# Patient Record
Sex: Male | Born: 1965 | Race: Asian | Hispanic: No | Marital: Married | Smoking: Never smoker
Health system: Southern US, Community
[De-identification: ages and names within clinical notes are randomized; demographics above are authoritative.]

## PROBLEM LIST (undated history)

## (undated) DIAGNOSIS — K759 Inflammatory liver disease, unspecified: Secondary | ICD-10-CM

## (undated) HISTORY — PX: SMALL INTESTINE SURGERY: SHX150

---

## 2011-05-29 ENCOUNTER — Ambulatory Visit: Payer: Self-pay | Admitting: Gastroenterology

## 2011-09-13 ENCOUNTER — Emergency Department (HOSPITAL_COMMUNITY)
Admission: EM | Admit: 2011-09-13 | Discharge: 2011-09-14 | Disposition: A | Discharge status: Home / Self Care | Payer: 59 | Attending: Emergency Medicine | Admitting: Emergency Medicine

## 2011-09-13 ENCOUNTER — Encounter: Payer: Self-pay | Admitting: *Deleted

## 2011-09-13 ENCOUNTER — Other Ambulatory Visit: Payer: Self-pay

## 2011-09-13 DIAGNOSIS — R071 Chest pain on breathing: Secondary | ICD-10-CM | POA: Insufficient documentation

## 2011-09-13 DIAGNOSIS — R0781 Pleurodynia: Secondary | ICD-10-CM

## 2011-09-13 NOTE — ED Notes (Signed)
Showed ECG to Dr. Bebe Shaggy.

## 2011-09-13 NOTE — ED Notes (Signed)
Chest pain on and off for about a few months intermittently and today his left chest pain was sharp and he couldn't breathe fully because it hurts more

## 2011-09-14 ENCOUNTER — Emergency Department (HOSPITAL_COMMUNITY): Payer: 59

## 2011-09-14 ENCOUNTER — Other Ambulatory Visit: Payer: Self-pay

## 2011-09-14 LAB — DIFFERENTIAL
Basophils Absolute: 0 10*3/uL (ref 0.0–0.1)
Basophils Relative: 0 % (ref 0–1)
Eosinophils Absolute: 0.2 10*3/uL (ref 0.0–0.7)
Eosinophils Relative: 4 % (ref 0–5)
Monocytes Absolute: 0.7 10*3/uL (ref 0.1–1.0)
Monocytes Relative: 11 % (ref 3–12)
Neutrophils Relative %: 44 % (ref 43–77)

## 2011-09-14 LAB — CBC
MCV: 69.2 fL — ABNORMAL LOW (ref 78.0–100.0)
Platelets: 146 10*3/uL — ABNORMAL LOW (ref 150–400)
RBC: 5.75 MIL/uL (ref 4.22–5.81)
WBC: 6.3 10*3/uL (ref 4.0–10.5)

## 2011-09-14 MED ORDER — NAPROXEN 500 MG PO TABS: 500.0000 mg | ORAL_TABLET | Freq: Two times a day (BID) | ORAL | Status: DC

## 2011-09-14 NOTE — ED Notes (Signed)
Patient back from  X-ray 

## 2011-09-14 NOTE — ED Notes (Signed)
MD at bedside. Dr.Miller evaluating patient. Patient is alert and oriented x3. resp even unlabored. Skin w/d. Distal pulses present. Patient reported that chest pain is gone now, but he had episodes of sharp pain with deep breathing. He denies any sob, denies n/v.

## 2011-09-14 NOTE — ED Provider Notes (Signed)
History     CSN: [id number] Arrival date & time: 09/13/2011 10:08 PM   First MD Initiated Contact with Patient 09/14/11 0019      Chief Complaint  Patient presents with  . Chest Pain    (Consider location/radiation/quality/duration/timing/severity/associated sxs/prior treatment) HPI Comments: Left-sided sharp and stabbing chest pain worse with breathing, onset 2-1/2 hours ago. 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 No swelling of the legs, trauma, surgery. Symptoms are improving over the last 2 and half hours and currently are mild even with deep breathin but initially this was what made his pain severe  Patient is a 45 y.o. male presenting with chest pain. The history is provided by the patient.  Chest Pain The chest pain began 3 - 5 hours ago. Chest pain occurs intermittently. The chest pain is improving. The pain is associated with breathing. The severity of the pain is mild. The quality of the pain is described as sharp and stabbing. The pain does not radiate. Chest pain is worsened by deep breathing. Pertinent negatives for primary symptoms include no fever, no fatigue, no syncope, no shortness of breath, no cough, no wheezing, no palpitations, no abdominal pain, no nausea, no vomiting and no dizziness.  Pertinent negatives for associated symptoms include no diaphoresis and no lower extremity edema. He tried nothing for the symptoms. Risk factors: Travel, patient states [place] and [place] frequently, last traveling 3 weeks ago.  Pertinent negatives for past medical history include no diabetes, no DVT, no hyperlipidemia, no hypertension, no PE and no strokes.  His family medical history is significant for stroke in family.  Procedure history is negative for cardiac catheterization, echocardiogram and exercise treadmill test.     History reviewed. No  pertinent past medical history.  History reviewed. No pertinent past surgical history.  History reviewed. No pertinent family history.  History  Substance Use Topics  . Smoking status: Never Smoker   . Smokeless tobacco: Not on file  . Alcohol Use: 0.6 oz/week    1 Glasses of wine per week      Review of Systems  Constitutional: Negative for fever, diaphoresis and fatigue.  Respiratory: Negative for cough, shortness of breath and wheezing.   Cardiovascular: Positive for chest pain. Negative for palpitations and syncope.  Gastrointestinal: Negative for nausea, vomiting and abdominal pain.  Neurological: Negative for dizziness.  All other systems reviewed and are negative.    Allergies  Review of patient's allergies indicates no known allergies.  Home Medications   Current Outpatient Rx  Name Route Sig Dispense Refill  . OMEGA-3 FATTY ACIDS 1000 MG PO CAPS Oral Take 1 g by mouth daily.      Marland Kitchen VITAMIN D (CHOLECALCIFEROL) PO Oral Take 1 tablet by mouth 2 (two) times a week.      Marland Kitchen NAPROXEN 500 MG PO TABS Oral Take 1 tablet (500 mg total) by mouth 2 (two) times daily. 30 tablet 0    BP 115/77  Pulse 55  Temp(Src) 97.8 F (36.6 C) (Oral)  Resp 16  SpO2 100%  Physical Exam  Nursing note and vitals reviewed. Constitutional: He appears well-developed and well-nourished. No distress.  HENT:  Head: Normocephalic and atraumatic.  Mouth/Throat: Oropharynx is clear and moist. No oropharyngeal exudate.  Eyes: Conjunctivae and EOM are normal. Pupils are equal, round, and reactive to light. Right eye exhibits no discharge. Left  eye exhibits no discharge. No scleral icterus.  Neck: Normal range of motion. Neck supple. No JVD present. No thyromegaly present.  Cardiovascular: Normal rate, regular rhythm, normal heart sounds and intact distal pulses.  Exam reveals no gallop and no friction rub.   No murmur heard. Pulmonary/Chest: Effort normal and breath sounds normal. No  respiratory distress. He has no wheezes. He has no rales. He exhibits no tenderness.  Abdominal: Soft. Bowel sounds are normal. He exhibits no distension and no mass. There is no tenderness.  Musculoskeletal: Normal range of motion. He exhibits no edema and no tenderness.  Lymphadenopathy:    He has no cervical adenopathy.  Neurological: He is alert. Coordination normal.  Skin: Skin is warm and dry. No rash noted. No erythema.  Psychiatric: He has a normal mood and affect. His behavior is normal.    ED Course  Procedures (including critical care time)  Labs Reviewed  CBC - Abnormal; Notable for the following:    Hemoglobin 12.9 (*)    MCV 69.2 (*)    MCH 22.4 (*)    Platelets 146 (*)    All other components within normal limits  DIFFERENTIAL  D-DIMER, QUANTITATIVE  POCT I-STAT TROPONIN I  I-STAT TROPONIN I  I-STAT TROPONIN I   Dg Chest 2 View  09/14/2011  *RADIOLOGY REPORT*  Clinical Data: Left-sided pleuritic chest pain.  CHEST - 2 VIEW  Comparison: None.  Findings: The lungs are well-aerated and clear.  There is no evidence of focal opacification, pleural effusion or pneumothorax.  The heart is normal in size; the mediastinal contour is within normal limits.  No acute osseous abnormalities are seen.  IMPRESSION: No acute cardiopulmonary process seen.  Original Report Authenticated By: Tonia Ghent, M.D.     1. Pleuritic chest pain       MDM  Totally normal exam without reproducible pain, pleural or pericardial rubs, hypoxia, or other abnormal vital signs. EKG shows a sinus bradycardia without any signs of ischemia or abnormality. We'll proceed with chest x-ray, d-dimer.  ED ECG REPORT   Date: 09/14/2011   Rate: 57  Rhythm: sinus bradycardia  QRS Axis: normal  Intervals: normal  ST/T Wave abnormalities: normal  Conduction Disutrbances:none  Narrative Interpretation:   Old EKG Reviewed: none available   Labs show no significant abnormalities, d-dimer normal,  chest x-ray negative for effusion or infection. EKG nonischemic and troponin normal. Findings described and explained to patient who has expressed his understanding, will initiate therapy with anti-inflammatories and followup as indicated. Doubt acute coronary syndrome given the patient's extremely low-risk, atypical nature of pain and laboratory workup findings.     Vida Roller, MD 09/14/11 [phone]

## 2011-09-14 NOTE — ED Notes (Signed)
Patient to Xray via stretcher.

## 2011-10-16 ENCOUNTER — Ambulatory Visit (INDEPENDENT_AMBULATORY_CARE_PROVIDER_SITE_OTHER): Payer: 59 | Admitting: Gastroenterology

## 2011-10-16 DIAGNOSIS — B181 Chronic viral hepatitis B without delta-agent: Secondary | ICD-10-CM

## 2011-10-23 NOTE — Progress Notes (Signed)
NAMEJOSEPH, BIAS    MR#:  [id number]      DATE:  10/16/2011  DOB:  [birth year]    cc: Kirby Funk, MD, [facility] [place], [address] [place], [address], [place], [place] [address], Fax number: [phone]    REFERRING AND PRIMARY CARE PHYSICIAN:  Kirby Funk, MD.   Reason for referral:  Genotype unknown, E antibody status unknown, hepatitis B, and elevated iron saturation.  According to the patient, he had no known history of hepatitis B until he underwent assessment as part of having a surrogate mother for him and his wife's child, in 2008 or 2009. He was discovered as part of  routine testing to be hepatitis B surface antigen positive, while living [place]. There was no further evaluation at that time. He subsequently moved to [facility] for work, around 01/24/2011.  He presented to his primary doctor, Dr. Valentina Lucks, on 01/17/2011, with complaints of feeling unwell, including headaches and nausea. At that time, on 01/17/2011, his ALT was 1012, with an AST of 451, ALP of 139,  and total bilirubin 1.1. Subsequently, on 01/31/2011, his ALT had fallen to 181, with an AST 45, ALP of 124, and total bilirubin of 0.8. On 01/27/2011, testing showed he was total hepatitis B core antibody  positive, hepatitis B surface antibody negative, hepatitis B surface antigen positive, hepatitis B/E antigen negative. HCV RNA was negative. On looking back at other notes, he was hepatitis C antibody  negative, on 01/24/2011. His iron saturation, on 01/24/2011, was 88%, and his ferritin was 222.7, on 01/24/2011. The patient reports that those symptoms resolved.  Currently, there are no symptoms to suggest active hepatitis B or decompensated liver disease. He has no symptoms of heart failure or MCP arthritis or history of diabetes.  With respect to risk factors for liver disease, he rarely drinks alcohol, perhaps no more than once a month. There is no history of intravenous or  intranasal drug, tattoos, unsterile body piercing, or  blood transfusion, prior to 1992.   In terms of his family history, he denies any family history of liver disease, but reports that his wife may have had hepatitis B and cleared this. They have 1 child, who was born in the [place]. He  believes the child has been vaccinated. He believes he may have started hepatitis A vaccine, as he was A-negative, but cannot recall completing this.   Past medical history:  Negative.    PAST SURGICAL HISTORY:  While living in [place] as a child, at the age of 1, he had removal of an intestinal worm; and, at the age of 49, he had tooth extraction.    Past psychiatric history:  During a break-up with a girlfriend, several years ago, he saw a therapist, but was never hospitalized, and did not have long-term pharmacologic or psychotherapy.    CURRENT MEDICATIONS:  Flonase p.r.n. for allergies and Ambien 10 mg p.o. at bedtime p.r.n.    ALLERGIES:  Denies.    Habits:  Smoking, denies. Alcohol, as above.   FAMILY HISTORY:  As above.   SOCIAL HISTORY:  He is married, with 1 child. 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 He immigrated from [place], at 45  years of age, to [place]. He had, combined, approximately 2-3 years of high school and college, back in [place], as a late teen.   REVIEW OF SYSTEMS:  All 10 systems reviewed today with the patient and are negative, other  than which is mentioned above. CES-D was 1.   PHYSICAL EXAMINATION:  Constitutional: Well-appearing, without stigmata of chronic liver disease. Vital signs: Height 68 inches, weight 163 pounds, blood pressure 130/91, pulse 81, temperature 97.9 Fahrenheit.  Ears, nose,  mouth and throat:  Unremarkable oropharynx.  No thyromegaly or neck masses.  Chest:  Resonant to percussion.  Clear to auscultation.  Cardiovascular:  Heart sounds normal S1, S2 without murmurs or rubs.   There is no peripheral  edema.  Abdominal:  Normal bowel sounds.  No masses or tenderness.  I could not appreciate a liver edge or spleen tip.  I could not appreciate any hernias.  Lymphatics:  No cervical or  inguinal lymphadenopathy.  Central Nervous System:  No asterixis or focal neurologic findings.  Dermatologic:  Anicteric without palmar  erythema or spider angiomata.  Eyes:  Anicteric sclerae.  Pupils are equal and reactive to light.   LAB WORK:  As mentioned above.   ASSESSMENT:  The patient is a 45 year old gentleman, with a history of most likely chronic hepatitis B. One wonders, in 01/2011, if he was not flaring and possibly clearing virus. We will see if this is true by repeating  his serologies today. Of course, this will also further characterize his hepatitis and, if he has not cleared, would determine the need for treatment.  In terms of the elevated ferritin and saturation, I suspect that these were elevated as an acute phase reactant. To be sure, the ferritin is quite high and somewhat higher than one would expect for an acute phase reactant, but it is also highly unlikely that an Asian would have hemochromatosis. These should be repeated.  In my discussion, today, with the patient, we discussed the nature and natural history of hepatitis B. I told him he may have flared or he may have been attempting to clear a virus, in 01/2011.  We discussed  assessment, with lab testing, today. We discussed treatment, and the basis for treatment. I also explained to him that it is unlikely he has hemochromatosis, but we will need to repeat his iron saturation  and ferritin testing. I have explained the importance of ensuring that his family is appropriately tested and vaccinated.   PLAN:  1. We will send hepatitis B serologies, including his HBV DNA, genotype, and delta antibody. 2. We will test iron saturation and ferritin. Because he had to go back to work, today, he is going to come back for his lab work, and  will do so fasting, which will improve the specificity of the iron saturation. 3. He will return in approximately 3 months' time, to follow up on the results of testing; but I will contact him earlier, should he need to commence therapy for hepatitis B. 4. I have encouraged him to review a number web sites, including the Hepatitis B Foundation and the [employer], for information regarding hepatitis B.            Brooke Dare, MD   ADDENDUM A week later there is no record of any labs being done using our [facility] account.  [id number] .[id number]  D:  Thu Dec 13 20:33:44 2012 ; T:  Fri Dec 14 19:37:29 2012  Job #:  [id number]

## 2011-12-19 LAB — PROTIME-INR: INR: 1.06 (ref <1.50)

## 2011-12-20 LAB — CBC WITH DIFFERENTIAL/PLATELET
Basophils Absolute: 0 10*3/uL (ref 0.0–0.1)
Eosinophils Absolute: 0.2 10*3/uL (ref 0.0–0.7)
Eosinophils Relative: 3 % (ref 0–5)
Lymphocytes Relative: 38 % (ref 12–46)
MCV: 71.1 fL — ABNORMAL LOW (ref 78.0–100.0)
Neutrophils Relative %: 51 % (ref 43–77)
Platelets: 177 10*3/uL (ref 150–400)
RDW: 14.7 % (ref 11.5–15.5)
WBC: 5.6 10*3/uL (ref 4.0–10.5)

## 2011-12-20 LAB — COMPLETE METABOLIC PANEL WITH GFR
AST: 31 U/L (ref 0–37)
Albumin: 4.5 g/dL (ref 3.5–5.2)
BUN: 17 mg/dL (ref 6–23)
CO2: 26 mEq/L (ref 19–32)
Calcium: 9.4 mg/dL (ref 8.4–10.5)
Chloride: 104 mEq/L (ref 96–112)
GFR, Est African American: >89 mL/min
Glucose, Bld: 143 mg/dL — ABNORMAL HIGH (ref 70–99)
Potassium: 4.3 mEq/L (ref 3.5–5.3)

## 2011-12-22 LAB — HEPATITIS B E ANTIBODY: Hepatitis Be Antibody: POSITIVE — AB

## 2011-12-22 LAB — HEPATITIS B E ANTIGEN: Hepatitis Be Antigen: NEGATIVE

## 2011-12-23 LAB — HEPATITIS B DNA, ULTRAQUANTITATIVE, PCR
Hepatitis B DNA (Calc): 132516 copies/mL — ABNORMAL HIGH (ref <116)
Hepatitis B DNA: 22769 IU/mL — ABNORMAL HIGH (ref <20)

## 2011-12-25 LAB — HEPATITIS DELTA ANTIBODY

## 2012-01-14 LAB — HEPATITIS B VIRUS CORE/PRECORE

## 2012-01-15 ENCOUNTER — Ambulatory Visit (INDEPENDENT_AMBULATORY_CARE_PROVIDER_SITE_OTHER): Payer: 59 | Admitting: Gastroenterology

## 2012-01-15 DIAGNOSIS — B181 Chronic viral hepatitis B without delta-agent: Secondary | ICD-10-CM

## 2012-01-22 NOTE — Progress Notes (Signed)
Roberto Carrillo, Roberto Carrillo    MR#:  [id number]      DATE:  01/15/2012  DOB:  12-27-1965    cc: Primary care physician:  Same Referring physician: Kirby Funk, MD, [facility] [place], [address] [place], [address], [place], [place] [address], Fax [phone]    REASON FOR VISIT:  Follow up of genotype B, E antibody positive hepatitis B.   history:   The patient returns today unaccompanied. Since last being seen, there have been no new events referable to his history of hepatitis B. There are no symptoms to suggest decompensated liver disease or vasculitis.   PAST MEDICAL HISTORY:  No interval change.   CURRENT MEDICATIONS:   1. Ambien 10 mg p.o. at bedtime p.r.n.  2. Flonase p.r.n. for allergies.   ALLERGIES: Denies.   HABITS: Smoking denies. Alcohol, denies interval consumption.   REVIEW OF SYSTEMS:  All 10 systems reviewed today with the patient and they are negative other than which was mentioned above. His CES-D was zero.   PHYSICAL EXAMINATION: Constitutional: Well appearing without significant peripheral wasting. Vital signs: Height 68 inches, weight 163 pounds, blood pressure 127/95, pulse 72, temperature 96.9 Fahrenheit.   LABORATORY STUDIES:  It should be noted that his lab work was delayed for some reason. His appointment was on 10/16/2011, but he did not get his labs until 12/19/2011. This demonstrated that he was E antibody positive, genotype B, with a viral load of 22,769 international units per mL. His ALT was 33 and his delta antibody was negative.   ASSESSMENT: The patient is a 46 year old gentleman with a history of E antibody positive, genotype B HBV. It should be noted that his liver enzymes are within normal range, but his viral load is well over 2000 international units per mL. This could indicate immune tolerance. I would like a biopsy to assess the degree of inflammation and fibrosis, particularly because he has had a long duration of  infection due to his ethnicity. If there is significant inflammation and fibrosis on his biopsy, it would be worth treating him. However, if there is no significant change on his biopsy, he could be observed.   In terms of his liver care, for some reason his hepatitis A testing has not been done, which can be done at his next visit because I am not planning on doing any lab work today. There are no symptoms of decompensation and he does not need to be screened for varices. He will need to start screening for hepatocellular cancer [facility]) because he is over the age of 32 and Asian. The first of these screening tests can be accomplished through his ultrasound-guided liver biopsy, which I planning to do.   In my discussion today with the patient, we discussed his lab results and the implications thereof. I explained that a biopsy would be of value to determine if he needs to be put on treatment. I discussed how a biopsy is done and the risks thereof. I have explained this will be done at [facility].   PLAN:  1. Order liver biopsy.  2. Will check hepatitis A antibody test at the next clinic appointment. 3. Once the result of the biopsy is in, will send him a letter regarding the results and if need be start him on therapy.  4. If the liver biopsy shows no significant change, we will see him again in 6 months' time, and then every 6 months for monitoring. At that visit I can start  ordering his ultrasounds.               Brooke Dare, MD   [phone]  D:  Thu Mar 14 16:47:42 2013 ; T:  Thu Mar 14 21:06:29 2013  Job #:  [id number]

## 2012-02-10 ENCOUNTER — Other Ambulatory Visit: Payer: Self-pay | Admitting: Radiology

## 2012-02-12 ENCOUNTER — Encounter (HOSPITAL_COMMUNITY): Payer: Self-pay | Admitting: Pharmacy Technician

## 2012-02-13 ENCOUNTER — Other Ambulatory Visit: Payer: Self-pay | Admitting: Radiology

## 2012-02-20 ENCOUNTER — Ambulatory Visit (HOSPITAL_COMMUNITY)
Admission: RE | Admit: 2012-02-20 | Discharge: 2012-02-20 | Disposition: A | Discharge status: Home / Self Care | Payer: 59 | Source: Ambulatory Visit | Attending: Gastroenterology | Admitting: Gastroenterology

## 2012-02-20 ENCOUNTER — Encounter (HOSPITAL_COMMUNITY): Payer: Self-pay

## 2012-02-20 VITALS — BP 112/79 | HR 69 | Temp 98.4°F | Resp 16 | Ht 68.0 in | Wt 158.0 lb

## 2012-02-20 DIAGNOSIS — B181 Chronic viral hepatitis B without delta-agent: Secondary | ICD-10-CM

## 2012-02-20 DIAGNOSIS — B1910 Unspecified viral hepatitis B without hepatic coma: Secondary | ICD-10-CM | POA: Insufficient documentation

## 2012-02-20 HISTORY — DX: Inflammatory liver disease, unspecified: K75.9

## 2012-02-20 LAB — CBC
HCT: 40.6 % (ref 39.0–52.0)
Hemoglobin: 13.6 g/dL (ref 13.0–17.0)
MCH: 22.8 pg — ABNORMAL LOW (ref 26.0–34.0)
MCHC: 33.5 g/dL (ref 30.0–36.0)

## 2012-02-20 MED ORDER — MIDAZOLAM HCL 2 MG/2ML IJ SOLN
INTRAMUSCULAR | Status: AC
  Filled 2012-02-20: qty 4

## 2012-02-20 MED ORDER — FENTANYL CITRATE 0.05 MG/ML IJ SOLN
INTRAMUSCULAR | Status: AC
  Filled 2012-02-20: qty 4

## 2012-02-20 MED ORDER — SODIUM CHLORIDE 0.9 % IV SOLN: Freq: Once | INTRAVENOUS | Status: DC

## 2012-02-20 MED ORDER — MIDAZOLAM HCL 2 MG/2ML IJ SOLN
INTRAMUSCULAR | Status: AC
  Filled 2012-02-20: qty 6

## 2012-02-20 MED ORDER — MIDAZOLAM HCL 5 MG/5ML IJ SOLN
INTRAMUSCULAR | Status: AC | PRN
  Administered 2012-02-20: 0.5 mg via INTRAVENOUS
  Administered 2012-02-20: 1 mg via INTRAVENOUS

## 2012-02-20 MED ORDER — FENTANYL CITRATE 0.05 MG/ML IJ SOLN
INTRAMUSCULAR | Status: AC | PRN
  Administered 2012-02-20 (×2): 25 ug via INTRAVENOUS

## 2012-02-20 MED ORDER — HYDROCODONE-ACETAMINOPHEN 5-325 MG PO TABS
1.0000 | ORAL_TABLET | ORAL | Status: DC | PRN
  Filled 2012-02-20: qty 1

## 2012-02-20 NOTE — Discharge Instructions (Addendum)
Liver Biopsy Care After These instructions give you information on caring for yourself after your procedure. Your doctor may also give you more specific instructions. Call your doctor if you have any problems or questions after your procedure. HOME CARE  Watch for bleeding at your biopsy site.   No heavy lifting, pushing, or pulling for 48 hours (2 days).   No exercise, jogging, or sex for 48 hours (2 days).   Do not drive or use heavy machinery for 24 hours (1 day).   Go back to your usual diet and medicines as told by your doctor.   Do not take the bandage off until the next morning.   Only take medicine as told by your doctor.   Do not shower or bathe until the next day.  GET HELP RIGHT AWAY IF:  You have shortness of breath or trouble breathing.   You have pain or cramping in your belly (abdomen).   You feel sick to your stomach (nauseous) or throw up (vomit).   Bleeding does not stop from the place where the needle was put in. Press on the place that is bleeding until you are checked in the Emergency Room.   Yellowish white fluid (pus) is coming from the place where the needle was put in.   You have any unusual pain that will not stop.   You have puffiness (swelling) or redness at the place where the needle was put in, or if the place is very sore or hot when you touch it.   You have a fever of more than 102 F (38.9 C) for 2 or more days.   You have black, smelly poops (bowel movements).  If you go to the Emergency Room, tell the nurse that you had a liver biopsy. Take this paper with you and show it to the nurse. Keep your follow-up appointment. MAKE SURE YOU:  Understand these instructions.   Will watch your condition.   Will get help right away if you are not doing well or get worse.  Document Released: 07/29/2008 Document Revised: 10/09/2011 Document Reviewed: 07/29/2008 [facility] Patient Information 2012 [employer]   Hepatitis B Hepatitis B is a  viral infection of the liver. Over half the people who become infected with hepatitis B never feel sick. However, some may later develop long-term liver disease (chronic hepatitis). There are 2 phases of the disease: sudden (acute) and longstanding (chronic). CAUSES Hepatitis B is caused by the hepatitis B virus (HBV). It can enter the body by sharing needles contaminated with blood from an infected person, by sharing intimate items such as toothbrushes and razors, or by sex with an infected person. A baby can get HBV from its birth mother. A caregiver may also get it from exposure to the blood of an infected patient by way of a cut or needle stick.  SYMPTOMS Acute Phase Many cases of acute HBV infection are mild and cause few problems.Some people may not even realize they are sick.Symptoms in others may last a few weeks to several months and include:  Loss of appetite.   Feeling very tired.   Nausea.   Vomiting.   Abdominal pain.   Dark yellow urine.   Yellow skin and eyes (jaundice).  Chronic Phase  About 5% of people who get HBV infection become "chronic carriers." They often have no symptoms, but the virus stays in their body. They may spread the virus to others and can get long-term liver disease. The younger a child  is when the infection starts, the more likely that child will be a carrier.   About 25% of chronic HBV carriers get a disease called "chronic active hepatitis." These people may develop scarring of the liver (cirrhosis), liver failure, or liver cancer.  DIAGNOSIS Your caregiver can do a blood test to see if you have the disease. TREATMENT Acute hepatitis B does not usually require any drug treatment. It is important to avoid medicines such as acetaminophen that may cause increasing liver damage.  Treatment with many antiviral drugs is available and recommended for some patients with hepatitis B infection. The goal is to reduce the risk of progressive chronic liver  disease, transmission of infection to others, and other long-term complications such as cirrhosis, liver failure, and liver cancer. Drug treatment is often advised for people with:  Acute liver failure.   Clinical complications of cirrhosis.   Cirrhosis or advanced fibrosis with high serum measurements of viral DNA.   Reactivation of chronic HBV after chemotherapy or immunosuppression.  Immediate drug treatment is not often advised for patients who have chronic infection but normal liver enzyme tests or patients who have a positive hepatitis B DNA test in blood but no other signs of active infection.Patients may have other circumstances that suggest a need or potential benefit from drug treatment. Successful treatment currently requires taking treatment drugs over a long period of time. An injected drug (interferon) may be given daily, 3 times a week, or once weekly for up to 1 year. An oral drug treatment plan may require daily dosing for many years or indefinitely, in order to prevent infection reactivation and worsening of liver disease. Side effects from these drugs are common and some may be very serious. Your response to treatment must be carefully monitored by both you and your caregiver throughout the entire treatment period. PREVENTION Hepatitis B vaccine is highly effective in preventing a hepatitis B infection.The vaccine is recommended worldwide for all newborns of hepatitis B infected mothers, and in many countries for all newborns. Hepatitis B vaccine is also recommended in the [place] for other people at higher than normal risk of getting an infection, including:  Sexually active people with multiple sex partners.   Homosexual and bisexual men.   People who live with someone who has hepatitis B.   Injection drug users.   Healthcare workers.   Patients on chronic hemodialysis and patients who need repeated blood or blood product transfusions.   Patients with chronic liver  disease due to any cause.   Unvaccinated people traveling to areas with high levels of local HBV infection.   Patients with diabetes.  Hepatitis B immune globulin (HBIG) is often given with hepatitis B vaccine to people who have been exposed to blood contaminated with HBV. The HBIG protects you from the virus for the first 1 to 3 months. After that, the hepatitis B vaccine takes over and gives you long-term protection. Your caregiver will help you decide whether and when to get these shots following exposure to HBV. Healthcare workers need to avoid injuries and wear appropriate protective equipment such as gloves, gowns, and face masks when performing invasive medical or nursing procedures.  HOME CARE INSTRUCTIONS   Rest when you feel tired, and eat when you are hungry.   Avoid a sexual relationship until advised otherwise by your caregiver.   Avoid activities that could expose other people to your blood. Examples include sharing a toothbrush, nail clippers, razors, and needles.   This infection is contagious.  Follow your caregiver's instructions in order to avoid spread of the infection.   Do not take any medicines until your caregiver says it is okay. This includes over-the-counter drugs such as acetominophen that are usually taken for fever or pain.  SEEK IMMEDIATE MEDICAL CARE IF:   You are unable to eat or drink.   You feel sick to your stomach (nauseous) or throw up (vomit).   You feel confused.   Jaundice becomes more severe.   You have trouble breathing, a rash, or swelling of the skin, throat, mouth, or face. You may be having an allergic reaction to the medicine in the shot.   You start twitching or shaking (seizure).   You become very sleepy or have trouble waking up.  MAKE SURE YOU:   Understand these instructions.   Will watch your condition.   Will get help right away if you are not doing well or get worse.  Document Released: 10/17/2000 Document Revised:  10/09/2011 Document Reviewed: 02/18/2011 [facility] Patient Information 2012 [place], [place].Marland Kitchen

## 2012-02-20 NOTE — Procedures (Signed)
Procedure : random liver core biopsy Specimen : 18 g x 3 cores Bleeding : minimal  Patient remained stable.  Tolerated well.  Meds : versed : 1.5 mg  Fentanyl : 50 mcg

## 2012-02-20 NOTE — H&P (Signed)
Roberto Carrillo is an 46 y.o. male.   Chief Complaint: Hepatitis B; aware x 5 yrs; probable contracted as child in [place]  HPI: scheduled for liver core biopsy  Past Medical History  Diagnosis Date  . Hepatitis     Past Surgical History  Procedure Date  . Small intestine surgery     as child    History reviewed. No pertinent family history. Social History:  reports that he has never smoked. He does not have any smokeless tobacco history on file. He reports that he drinks about .6 ounces of alcohol per week. He reports that he uses illicit drugs.  Allergies: No Known Allergies  Medications Prior to Admission  Medication Sig Dispense Refill  . famotidine (PEPCID) 20 MG tablet Take 20 mg by mouth 2 (two) times daily as needed. For acid reflux       Medications Prior to Admission  Medication Dose Route Frequency Provider Last Rate Last Dose  . 0.9 %  sodium chloride infusion   Intravenous Once Roberto Leu, PA        No results found for this or any previous visit (from the past 48 hour(s)). No results found.  Review of Systems  Constitutional: Negative for fever.  Cardiovascular: Negative for chest pain.  Gastrointestinal: Negative for nausea and vomiting.  Musculoskeletal: Negative for back pain.  Neurological: Negative for headaches.    Blood pressure 126/80, pulse 79, temperature 98.4 F (36.9 C), temperature source Oral, height 5\' 8"  (1.727 m), weight 158 lb (71.668 kg), SpO2 99.00%. Physical Exam  Constitutional: He is oriented to person, place, and time. He appears well-developed and well-nourished.  Cardiovascular: Normal rate, regular rhythm and normal heart sounds.   No murmur heard. Respiratory: Effort normal and breath sounds normal. He has no wheezes.  GI: Soft. Bowel sounds are normal. There is no tenderness.  Musculoskeletal: Normal range of motion.  Neurological: He is alert and oriented to person, place, and time.  Skin: Skin is warm.  Psychiatric: He  has a normal mood and affect. His behavior is normal. Judgment and thought content normal.     Assessment/Plan Dx Hepatitis B 5 yrs ago MD feels probably contracted as child in [place] Scheduled now for liver core biopsy Pt aware of procedure benefits and risks and agreeable to proceed. Consent signed  Roberto Carrillo A 02/20/2012, 9:48 AM

## 2012-02-26 ENCOUNTER — Encounter: Payer: Self-pay | Admitting: Gastroenterology

## 2012-03-11 ENCOUNTER — Encounter: Payer: Self-pay | Admitting: Gastroenterology

## 2012-11-29 IMAGING — CR DG CHEST 2V
2 series · 2 of 2 positions shown · non-contrast
Comparison: None.

CLINICAL DATA: Left-sided pleuritic chest pain.

CHEST - 2 VIEW

[w chest pa]
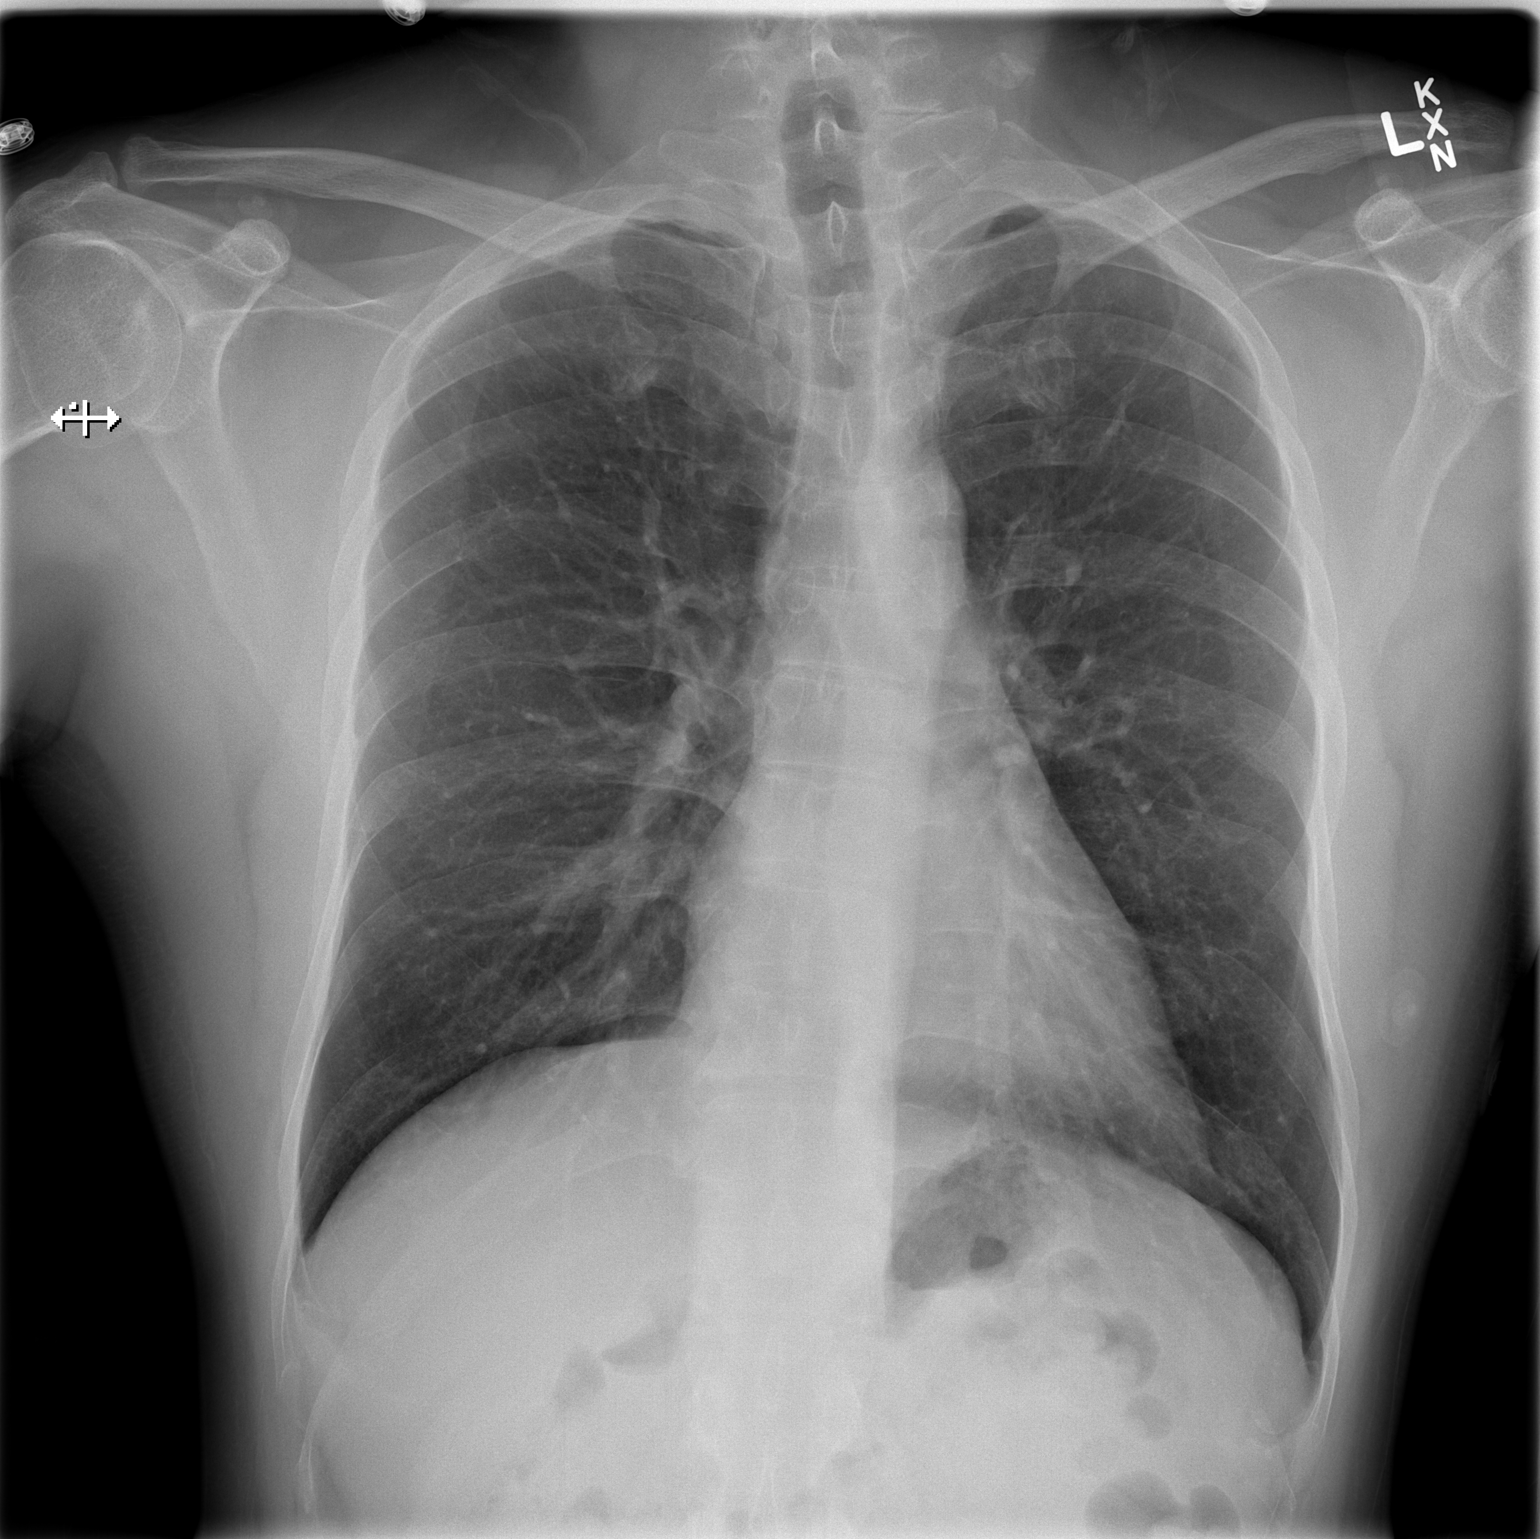

[w chest lat]
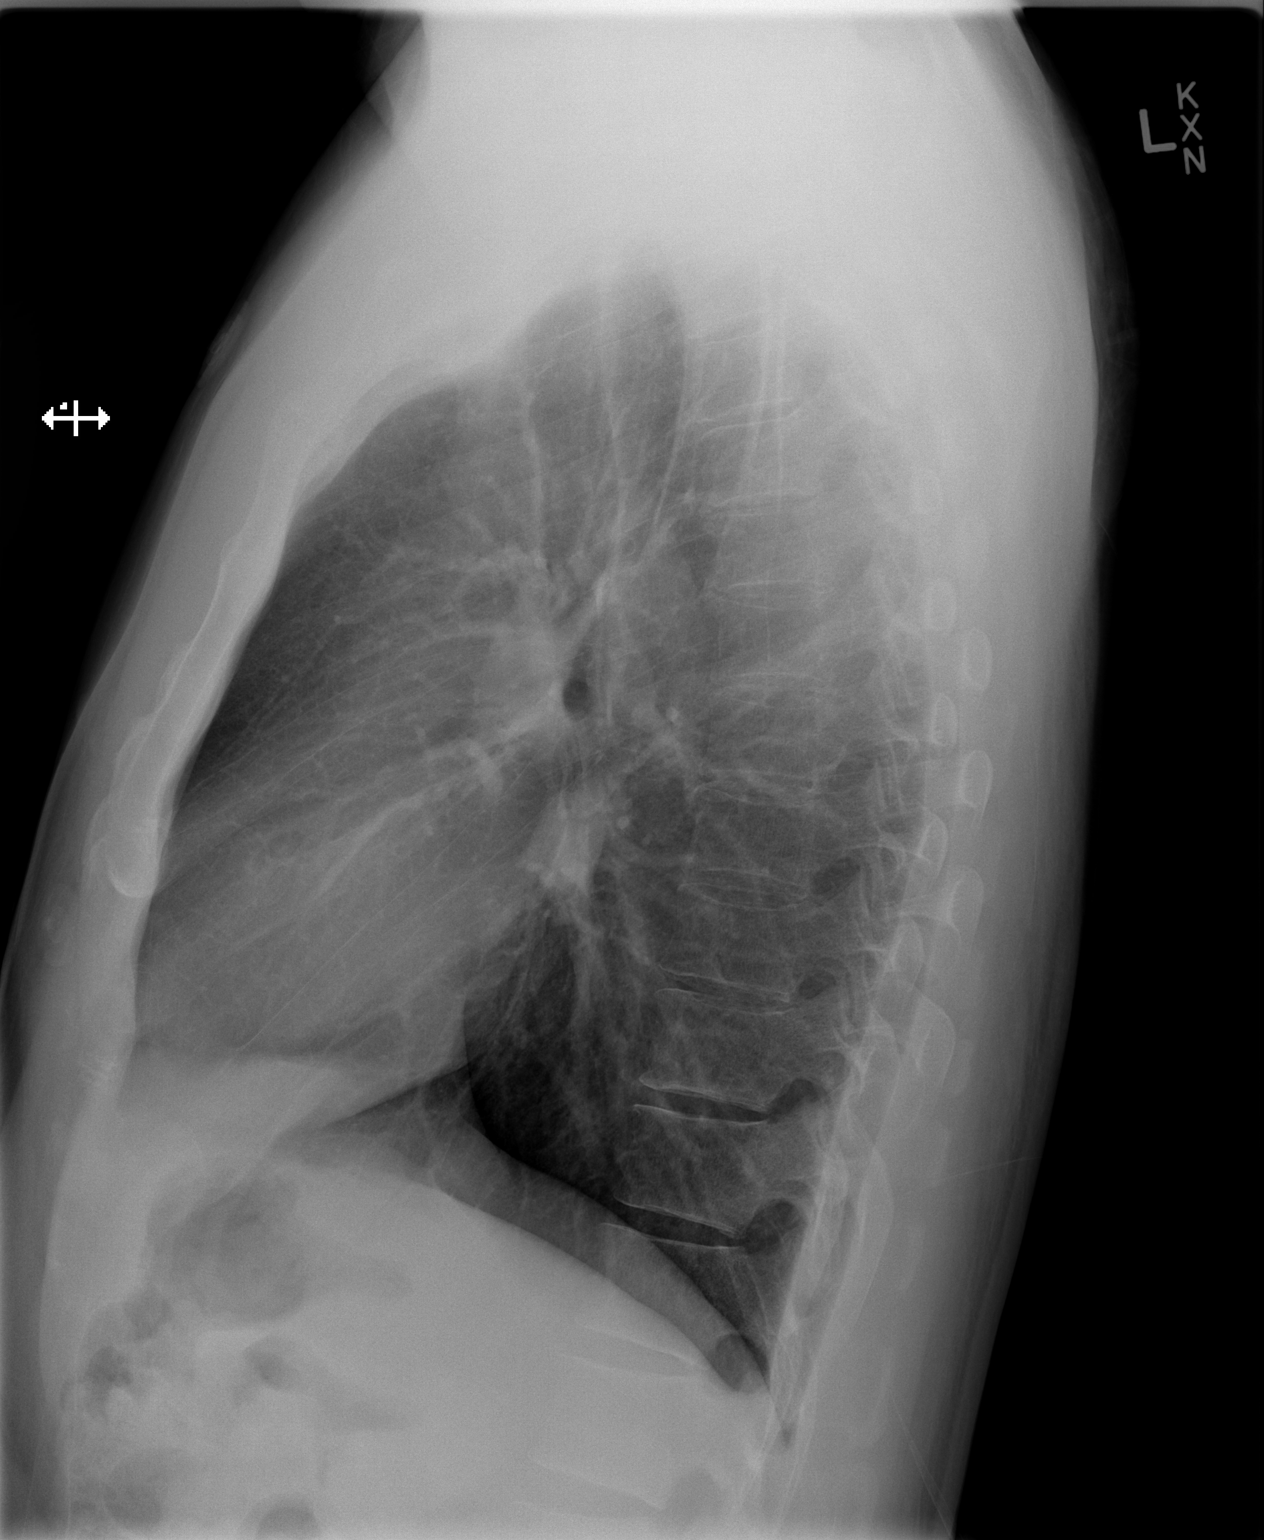

[2 of 2 positions shown; findings below may reference images not displayed]

FINDINGS: The lungs are well-aerated and clear.  There is no
evidence of focal opacification, pleural effusion or pneumothorax.

The heart is normal in size; the mediastinal contour is within
normal limits.  No acute osseous abnormalities are seen.
IMPRESSION: No acute cardiopulmonary process seen.

## 2013-03-02 ENCOUNTER — Ambulatory Visit (INDEPENDENT_AMBULATORY_CARE_PROVIDER_SITE_OTHER): Payer: 59

## 2013-03-02 ENCOUNTER — Ambulatory Visit (INDEPENDENT_AMBULATORY_CARE_PROVIDER_SITE_OTHER): Payer: 59 | Admitting: Neurology

## 2013-03-02 DIAGNOSIS — Z0289 Encounter for other administrative examinations: Secondary | ICD-10-CM

## 2013-03-02 DIAGNOSIS — R209 Unspecified disturbances of skin sensation: Secondary | ICD-10-CM

## 2013-03-02 NOTE — Procedures (Signed)
  HISTORY:  Roberto Carrillo is a 47 year old gentleman with a two-month history of intermittent numbness and tingling involving the right forearm and hand. The patient indicates that the symptoms only come on while he is typing, and he is putting pressure on the mid forearm on the right. The patient indicates that otherwise, he feels normal. The patient denies any weakness of the right arm. The patient denies any neck or shoulder discomfort. The patient is being evaluated for a possible carpal tunnel syndrome.  NERVE CONDUCTION STUDIES:  Nerve conduction studies were performed on both upper extremities. The distal motor latencies and motor amplitudes for the median and ulnar nerves were within normal limits. The F wave latencies and nerve conduction velocities for these nerves were also normal. The sensory latencies for the median and ulnar nerves were normal.    EMG STUDIES:  EMG study was performed on the right upper extremity:  The first dorsal interosseous muscle reveals 2 to 4 K units with full recruitment. No fibrillations or positive waves were noted. The abductor pollicis brevis muscle reveals 2 to 4 K units with full recruitment. No fibrillations or positive waves were noted. The extensor indicis proprius muscle reveals 1 to 3 K units with full recruitment. No fibrillations or positive waves were noted. The pronator teres muscle reveals 2 to 3 K units with full recruitment. No fibrillations or positive waves were noted. The biceps muscle reveals 1 to 2 K units with full recruitment. No fibrillations or positive waves were noted. The triceps muscle reveals 2 to 4 K units with full recruitment. No fibrillations or positive waves were noted. The anterior deltoid muscle reveals 2 to 3 K units with full recruitment. No fibrillations or positive waves were noted. The cervical paraspinal muscles were tested at 2 levels. No abnormalities of insertional activity were seen at either level tested.  There was good relaxation.    IMPRESSION:  Nerve conduction studies done on both upper extremities were within normal limits. There is no evidence of carpal tunnel syndrome on either side. EMG evaluation of the right upper extremity is normal. There is no evidence of an overlying cervical radiculopathy.  Marlan Palau MD 03/02/2013 10:39 AM  [facility] [address] [place], [place] [address]  Phone [phone] Fax [phone]

## 2014-12-19 ENCOUNTER — Other Ambulatory Visit: Payer: Self-pay | Admitting: Internal Medicine

## 2014-12-19 DIAGNOSIS — B181 Chronic viral hepatitis B without delta-agent: Secondary | ICD-10-CM

## 2014-12-28 ENCOUNTER — Other Ambulatory Visit: Payer: Self-pay | Admitting: Dermatology

## 2015-01-01 ENCOUNTER — Ambulatory Visit: Payer: Self-pay | Admitting: Infectious Diseases

## 2015-01-08 ENCOUNTER — Ambulatory Visit
Admission: RE | Admit: 2015-01-08 | Discharge: 2015-01-08 | Disposition: A | Discharge status: Home / Self Care | Payer: Self-pay | Source: Ambulatory Visit | Attending: Internal Medicine | Admitting: Internal Medicine

## 2015-01-08 DIAGNOSIS — B181 Chronic viral hepatitis B without delta-agent: Secondary | ICD-10-CM

## 2015-01-16 ENCOUNTER — Ambulatory Visit: Payer: Self-pay | Admitting: Internal Medicine

## 2015-01-23 ENCOUNTER — Encounter: Payer: Self-pay | Admitting: Internal Medicine

## 2015-01-23 ENCOUNTER — Ambulatory Visit (INDEPENDENT_AMBULATORY_CARE_PROVIDER_SITE_OTHER): Payer: BLUE CROSS/BLUE SHIELD | Admitting: Internal Medicine

## 2015-01-23 VITALS — BP 132/87 | HR 74 | Temp 98.2°F | Ht 68.0 in | Wt 158.0 lb

## 2015-01-23 DIAGNOSIS — B181 Chronic viral hepatitis B without delta-agent: Secondary | ICD-10-CM

## 2015-01-23 DIAGNOSIS — R3915 Urgency of urination: Secondary | ICD-10-CM | POA: Diagnosis not present

## 2015-01-23 DIAGNOSIS — G43001 Migraine without aura, not intractable, with status migrainosus: Secondary | ICD-10-CM | POA: Diagnosis not present

## 2015-01-23 DIAGNOSIS — J302 Other seasonal allergic rhinitis: Secondary | ICD-10-CM | POA: Diagnosis not present

## 2015-01-23 DIAGNOSIS — G43909 Migraine, unspecified, not intractable, without status migrainosus: Secondary | ICD-10-CM | POA: Insufficient documentation

## 2015-01-23 DIAGNOSIS — K7460 Unspecified cirrhosis of liver: Secondary | ICD-10-CM

## 2015-01-23 NOTE — Assessment & Plan Note (Addendum)
I discussed the natural progression of hepatitis B and possibility that he will continue to remain asymptomatic with no issues in his liver. He has not had any liver inflammation based on persistently normal transaminases as well as a biopsy in 2013. I suspect he will continue with no indication for treatment. Indication for treatment would be with a rise in his transaminases greater than 2 times normal as well as his viral load which is over 2000. Treatment would only be indicated if he develops significant inflammation of his liver. Based on his biopsy a don't suspect that will be the case but I will do noninvasive testing for him with elastography in 6 months. This will also be for his hepatocellular carcinoma screening. I will continue to follow his labs and ultrasound every 6 months as well. Did discuss with him the risk for liver cancer in this risk is at any time with hepatitis B even without development of cirrhosis. He also discussed with him that there are hopefully future potential treatment options that are cure though the evidence has not been established yet. All questions answered. I will get his viral DNA today to be sure it is not significantly changed or to see if it is still active and also repeat his E antigen and E antibody.

## 2015-01-23 NOTE — Progress Notes (Signed)
   Subjective:    Patient ID: Roberto Carrillo, male    DOB: 09/13/1966, 49 y.o.   MRN: [id number]  HPI He comes in as a new patient evaluation for hepatitis B. He was first diagnosed with hepatitis B around 2006 and previously was seen by the hepatology clinic, last seen in 2013, [facility]. At that time, he was E antigen negative but was eating antibody positive. He had a viral load of 22,000 international units and did have a liver biopsy in 2013 which showed minimal inflammation, a 1 and no difficult fibrosis. Treatment was deferred. He has not really had follow-up since. He is unsure when he got appetite is be. He does not recall his mother ever having it. [place] was [place] and did have a surgery as a baby. No history of IV drug use. He did have a recent screening ultrasound which was negative for any mass. He feels well and has had no symptoms of abdominal distention, encephalopathy. He did have recent labs by his primary physician and his AST and ALT were within normal limits. He has been vaccinated for hepatitis a by his primary physician. No history of liver cancer in his family.   Review of Systems  Constitutional: Negative for fatigue and unexpected weight change.  Gastrointestinal: Negative for nausea, abdominal pain, diarrhea, blood in stool and abdominal distention.  Skin: Negative for rash.  Neurological: Negative for dizziness, light-headedness and headaches.       Objective:   Physical Exam  Constitutional: He appears well-developed and well-nourished. No distress.  HENT:  Mouth/Throat: No oropharyngeal exudate.  Eyes: No scleral icterus.  Cardiovascular: Normal rate, regular rhythm and normal heart sounds.   No murmur heard. Pulmonary/Chest: Effort normal and breath sounds normal. No respiratory distress. He has no wheezes.  Abdominal: Soft. Bowel sounds are normal. He exhibits no distension. There is no tenderness.    Musculoskeletal: He exhibits no edema.  Neurological: He is alert.  Skin: No rash noted.          Assessment & Plan:

## 2015-01-24 LAB — HEPATITIS B SURFACE ANTIGEN: HEP B S AG: POSITIVE — AB

## 2015-01-24 LAB — HEPATITIS B SURF AG CONFIRMATION: HEPATITIS B SURFACE ANTIGEN CONFIRMATION: POSITIVE — AB

## 2015-01-25 LAB — HEPATITIS B E ANTIBODY: Hepatitis Be Antibody: REACTIVE — AB

## 2015-01-25 LAB — HEPATITIS B E ANTIGEN: HEPATITIS BE ANTIGEN: NONREACTIVE

## 2015-01-26 LAB — HEPATITIS B DNA, ULTRAQUANTITATIVE, PCR
HEPATITIS B DNA (CALC): 98952 {copies}/mL — AB (ref <116)
HEPATITIS B DNA: 17002 [IU]/mL — AB (ref <20)

## 2015-03-02 ENCOUNTER — Other Ambulatory Visit: Payer: Self-pay | Admitting: Gastroenterology

## 2015-07-26 ENCOUNTER — Ambulatory Visit (HOSPITAL_COMMUNITY): Payer: BLUE CROSS/BLUE SHIELD

## 2016-03-25 IMAGING — US US ABDOMEN LIMITED
1 series · 14 of 25 positions shown · non-contrast
Comparison: None.

CLINICAL DATA: Hepatitis-B.

EXAM:
US ABDOMEN LIMITED - RIGHT UPPER QUADRANT

[Series 1: us abdomen limited · 0.26mm/px · 14 of 38 slices shown]
[im 1/38]
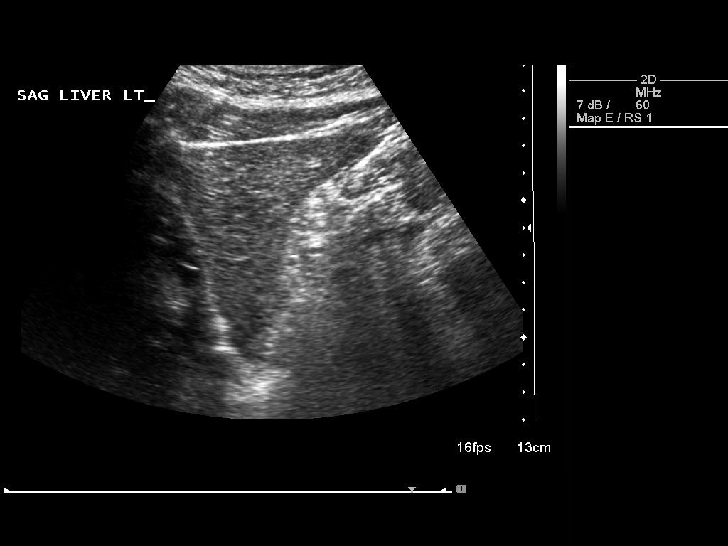
[im 4/38]
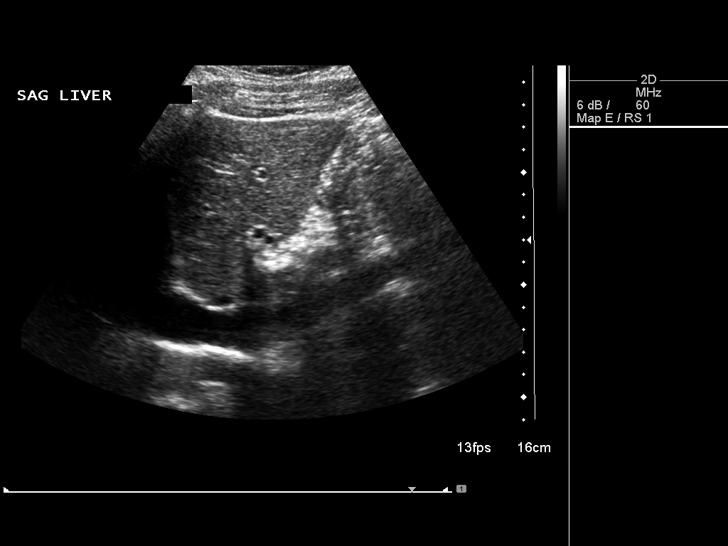
[im 7/38]
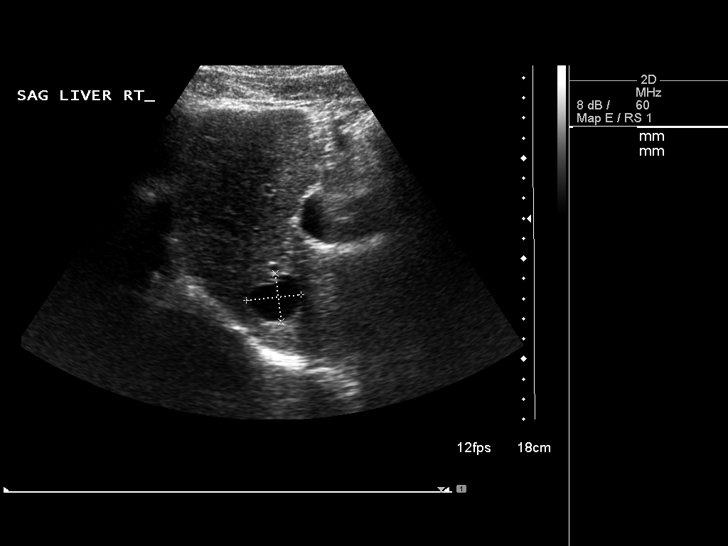
[im 10/38]
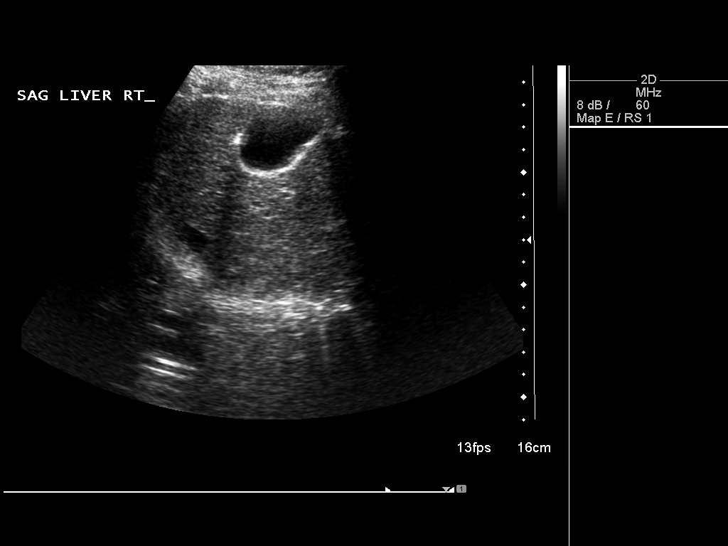
[im 13/38]
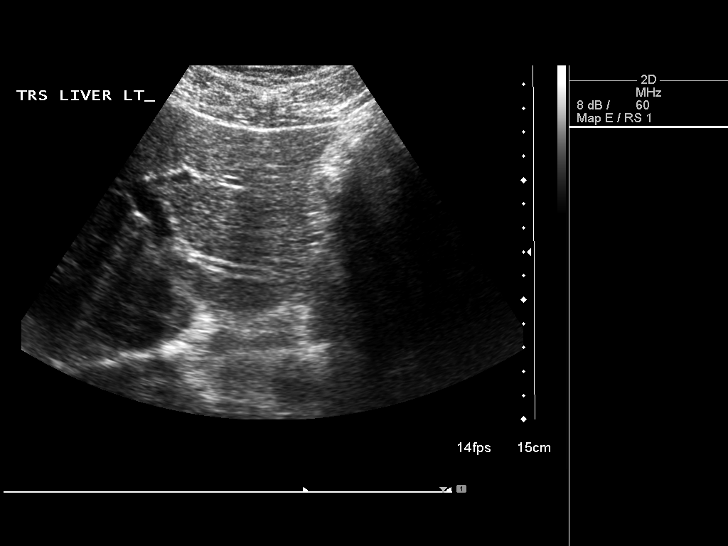
[im 14/38]
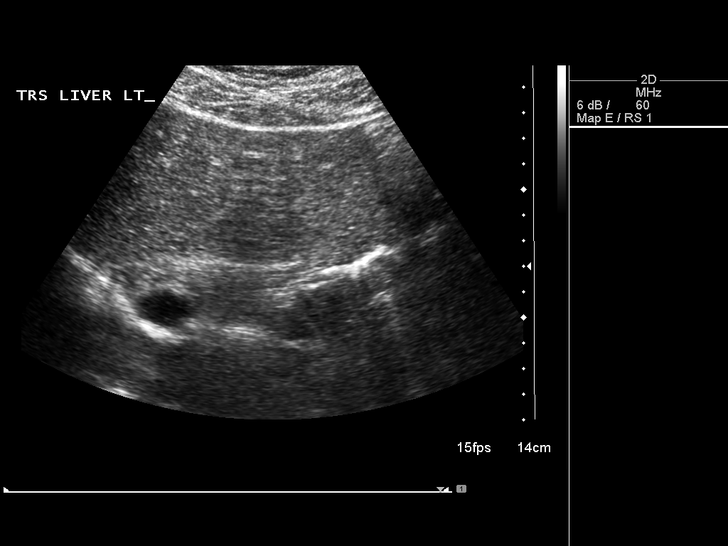
[im 17/38]
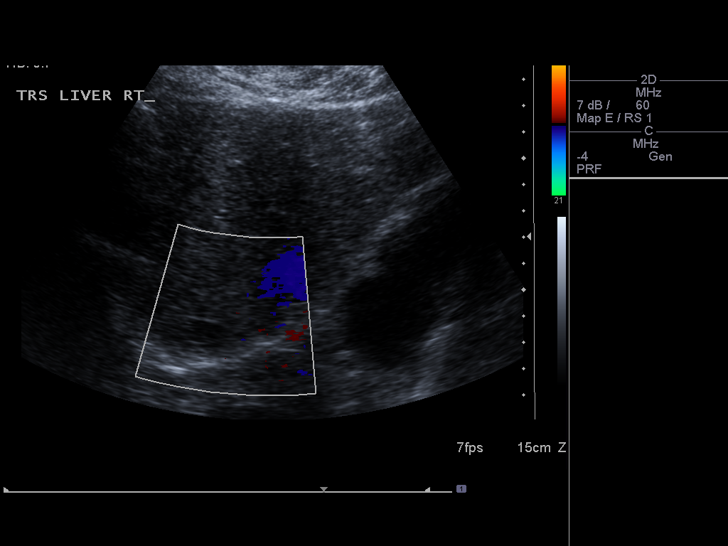
[im 21/38]
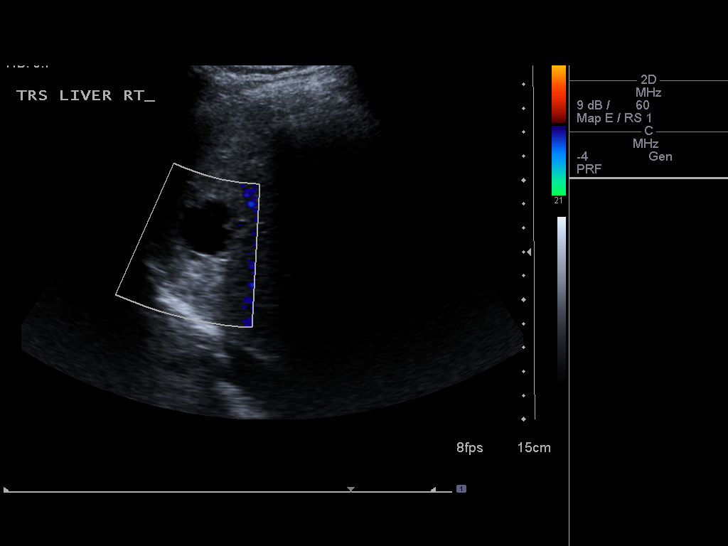
[im 24/38]
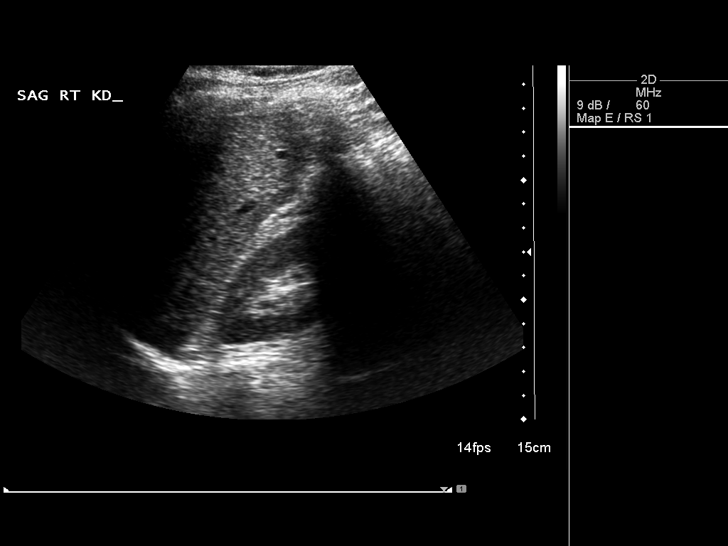
[im 25/38]
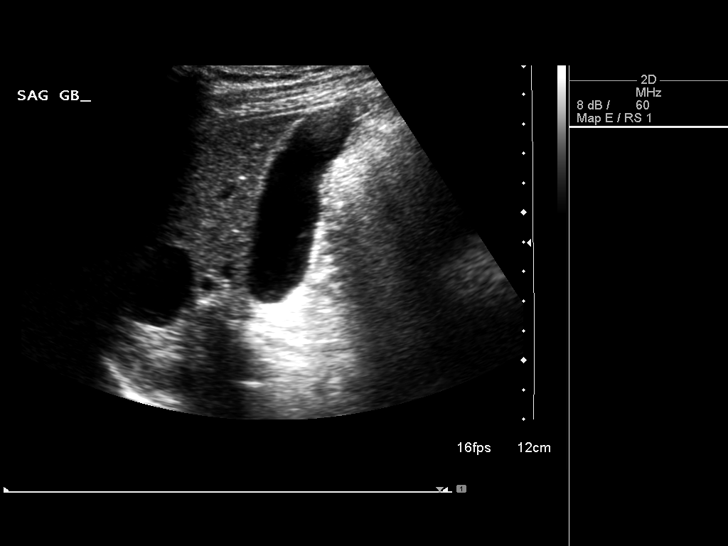
[im 28/38]
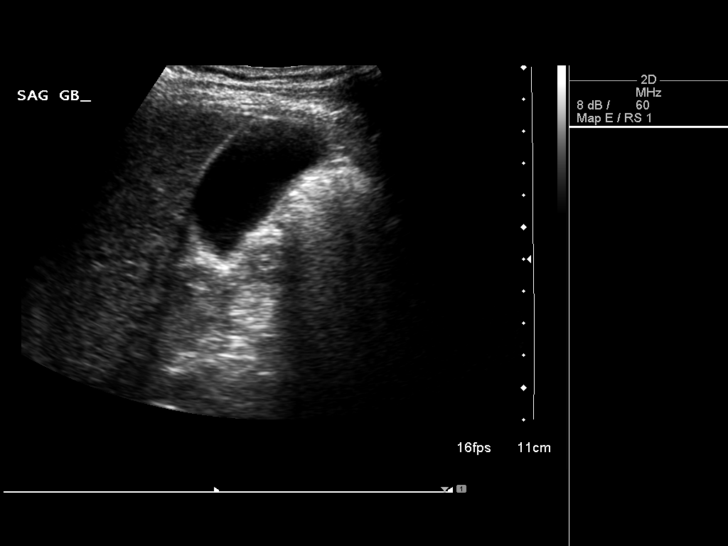
[im 31/38]
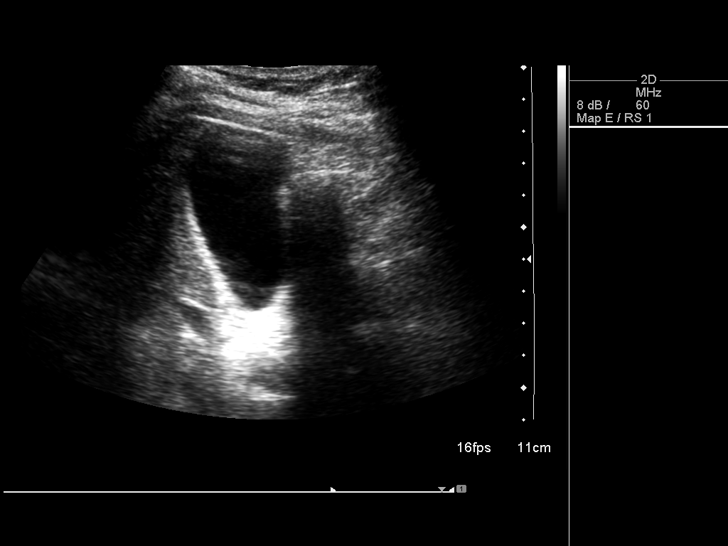
[im 34/38]
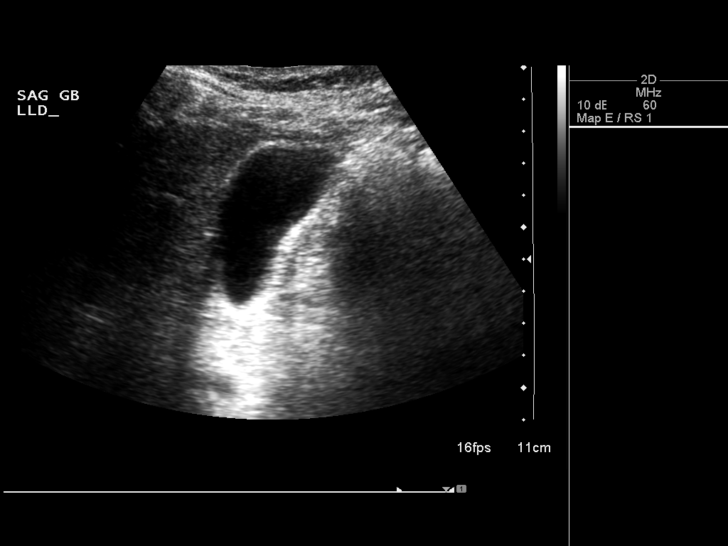
[im 38/38]
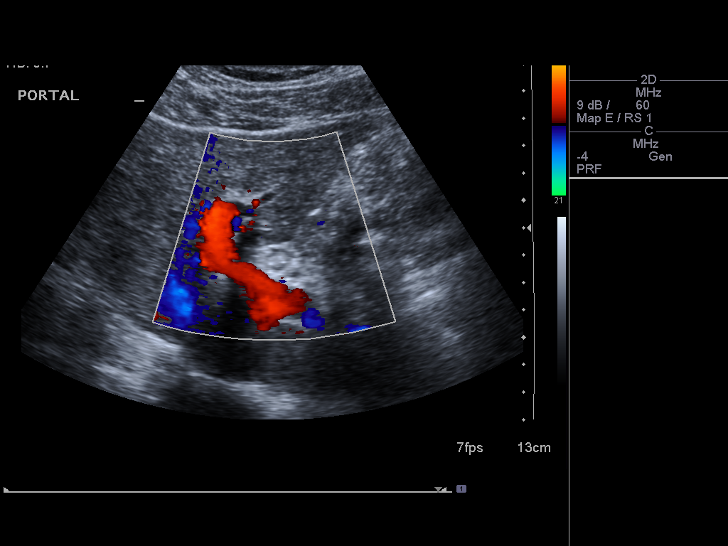

[14 of 25 positions shown; findings below may reference images not displayed]

FINDINGS: Gallbladder:

No gallstones or wall thickening visualized. No sonographic Murphy
sign noted.

Common bile duct:

Diameter: 5 mm

Liver:

A 2.9 and 1.6 cm simple cyst noted. No significant focal hepatic
lesion noted. Hepatic echotexture is normal.
IMPRESSION: 1. Small simple cysts in the liver.
2. Otherwise normal exam.
